# Patient Record
Sex: Male | Born: 1970 | Race: White | Hispanic: No | Marital: Married | Smoking: Never smoker
Health system: Southern US, Community
[De-identification: ages and names within clinical notes are randomized; demographics above are authoritative.]

## PROBLEM LIST (undated history)

## (undated) DIAGNOSIS — J45909 Unspecified asthma, uncomplicated: Secondary | ICD-10-CM

---

## 2012-02-21 ENCOUNTER — Encounter (HOSPITAL_BASED_OUTPATIENT_CLINIC_OR_DEPARTMENT_OTHER): Payer: Self-pay | Admitting: Emergency Medicine

## 2012-02-21 ENCOUNTER — Emergency Department (HOSPITAL_BASED_OUTPATIENT_CLINIC_OR_DEPARTMENT_OTHER)
Admission: EM | Admit: 2012-02-21 | Discharge: 2012-02-21 | Disposition: A | Discharge status: Home / Self Care | Payer: Self-pay | Attending: Emergency Medicine | Admitting: Emergency Medicine

## 2012-02-21 ENCOUNTER — Emergency Department (HOSPITAL_BASED_OUTPATIENT_CLINIC_OR_DEPARTMENT_OTHER): Payer: Self-pay

## 2012-02-21 DIAGNOSIS — J45909 Unspecified asthma, uncomplicated: Secondary | ICD-10-CM

## 2012-02-21 DIAGNOSIS — R509 Fever, unspecified: Secondary | ICD-10-CM | POA: Insufficient documentation

## 2012-02-21 DIAGNOSIS — J45901 Unspecified asthma with (acute) exacerbation: Secondary | ICD-10-CM | POA: Insufficient documentation

## 2012-02-21 DIAGNOSIS — R059 Cough, unspecified: Secondary | ICD-10-CM | POA: Insufficient documentation

## 2012-02-21 DIAGNOSIS — J40 Bronchitis, not specified as acute or chronic: Secondary | ICD-10-CM | POA: Insufficient documentation

## 2012-02-21 DIAGNOSIS — R05 Cough: Secondary | ICD-10-CM | POA: Insufficient documentation

## 2012-02-21 HISTORY — DX: Unspecified asthma, uncomplicated: J45.909

## 2012-02-21 MED ORDER — ALBUTEROL SULFATE (5 MG/ML) 0.5% IN NEBU
5.0000 mg | INHALATION_SOLUTION | Freq: Once | RESPIRATORY_TRACT | Status: AC
  Administered 2012-02-21: 5 mg via RESPIRATORY_TRACT
  Filled 2012-02-21: qty 1

## 2012-02-21 MED ORDER — METHYLPREDNISOLONE SODIUM SUCC 125 MG IJ SOLR
125.0000 mg | Freq: Every day | INTRAMUSCULAR | Status: DC
  Administered 2012-02-21: 125 mg via INTRAMUSCULAR

## 2012-02-21 MED ORDER — IPRATROPIUM BROMIDE 0.02 % IN SOLN
RESPIRATORY_TRACT | Status: AC
  Filled 2012-02-21: qty 2.5

## 2012-02-21 MED ORDER — ALBUTEROL SULFATE (5 MG/ML) 0.5% IN NEBU
INHALATION_SOLUTION | RESPIRATORY_TRACT | Status: AC
  Filled 2012-02-21: qty 1

## 2012-02-21 MED ORDER — AZITHROMYCIN 250 MG PO TABS: 250.0000 mg | ORAL_TABLET | Freq: Every day | ORAL | Status: AC

## 2012-02-21 MED ORDER — HYDROCODONE-HOMATROPINE 5-1.5 MG/5ML PO SYRP: 5.0000 mL | ORAL_SOLUTION | Freq: Four times a day (QID) | ORAL | Status: AC | PRN

## 2012-02-21 MED ORDER — METHYLPREDNISOLONE SODIUM SUCC 125 MG IJ SOLR
125.0000 mg | Freq: Every day | INTRAMUSCULAR | Status: DC
  Filled 2012-02-21: qty 2

## 2012-02-21 MED ORDER — IPRATROPIUM BROMIDE 0.02 % IN SOLN
0.5000 mg | Freq: Once | RESPIRATORY_TRACT | Status: AC
  Administered 2012-02-21: 0.5 mg via RESPIRATORY_TRACT

## 2012-02-21 MED ORDER — ALBUTEROL SULFATE (5 MG/ML) 0.5% IN NEBU
5.0000 mg | INHALATION_SOLUTION | Freq: Once | RESPIRATORY_TRACT | Status: AC
  Administered 2012-02-21: 5 mg via RESPIRATORY_TRACT

## 2012-02-21 MED ORDER — PREDNISONE 10 MG PO TABS: ORAL_TABLET | ORAL | Status: DC

## 2012-02-21 NOTE — ED Notes (Addendum)
Pt having URI symptoms since 02-05-12.   Pt having severe coughing spells and have caused a syncopal episode.  Pt having chest tightness when out in the cold.  No known fever.  Pt also has  Itchy rash to left lower arm.  Pt seen at [facility] for bronchitis earlier this month.

## 2012-02-21 NOTE — ED Provider Notes (Signed)
History     CSN: [id number]  Arrival date & time 02/21/12  1146   First MD Initiated Contact with Patient 02/21/12 1324      Chief Complaint  Patient presents with  . Nasal Congestion  . Cough  . Shortness of Breath    (Consider location/radiation/quality/duration/timing/severity/associated sxs/prior treatment) Patient is a 42 y.o. male presenting with cough. The history is provided by the patient. No language interpreter was used.  Cough Cough characteristics:  Productive Sputum characteristics:  White Severity:  Moderate Onset quality:  Gradual Timing:  Constant Progression:  Worsening Chronicity:  Recurrent Smoker: no   Relieved by:  Nothing Worsened by:  Nothing tried Associated symptoms: fever   Risk factors: recent infection   Risk factors: no chemical exposure   Pt reports he recently has been treat with kefles and prednisone.   Pt has continued wheezing.   Pt using nebulizer at home  Past Medical History  Diagnosis Date  . Asthma     No past surgical history on file.  No family history on file.  History  Substance Use Topics  . Smoking status: Not on file  . Smokeless tobacco: Not on file  . Alcohol Use: Not on file      Review of Systems  Constitutional: Positive for fever.  Respiratory: Positive for cough.   All other systems reviewed and are negative.    Allergies  Aspirin; Penicillins; and Tetanus toxoids  Home Medications   Current Outpatient Rx  Name  Route  Sig  Dispense  Refill  . guaiFENesin-dextromethorphan (ROBITUSSIN DM) 100-10 MG/5ML syrup   Oral   Take 5 mLs by mouth 3 (three) times daily as needed for cough.           BP 108/76  Pulse 92  Temp(Src) 98.9 F (37.2 C) (Oral)  Resp 18  Ht 5\' 4"  (1.626 m)  Wt 230 lb (104.327 kg)  BMI 39.46 kg/m2  SpO2 94%  Physical Exam  Nursing note and vitals reviewed. Constitutional: He is oriented to person, place, and time. He appears well-developed and well-nourished.  HENT:   Left Ear: External ear normal.  Eyes: Pupils are equal, round, and reactive to light.  Neck: Normal range of motion. Neck supple.  Cardiovascular: Normal rate and normal heart sounds.   Pulmonary/Chest: Effort normal and breath sounds normal.  Abdominal: Soft. Bowel sounds are normal.  Musculoskeletal: Normal range of motion.  Neurological: He is alert and oriented to person, place, and time.  Skin: Skin is warm.  Psychiatric: He has a normal mood and affect.    ED Course  Procedures (including critical care time)  Labs Reviewed - No data to display Dg Chest 2 View  02/21/2012  *RADIOLOGY REPORT*  Clinical Data: Nasal congestion, cough, shortness of breath  CHEST - 2 VIEW  Comparison: None.  Findings: No active infiltrate or effusion is seen. There is some prominence of peribronchial thickening and bronchitis is a consideration.  Mediastinal contours appear normal.  The heart is within normal limits in size in view of the poor inspiration taken. No acute skeletal abnormality is seen.  IMPRESSION: No pneumonia.  Cannot exclude bronchitis.   Original Report Authenticated By: Dwyane Dee, M.D.      1. Bronchitis   2. Asthma       MDM      Pt given solumedrol and albuterol here.   I will put pt on prednisone taper, zithromax and tussionex.    I advised pt to see  his MD for recheck on Monday.       Lonia Skinner [place], [place] 02/21/12 [phone]

## 2012-02-22 NOTE — ED Provider Notes (Signed)
History/physical exam/procedure(s) were performed by non-physician practitioner and as supervising physician I was immediately available for consultation/collaboration. I have reviewed all notes and am in agreement with care and plan.   Hilario Quarry, MD 02/22/12 [phone]

## 2013-12-26 ENCOUNTER — Emergency Department (HOSPITAL_BASED_OUTPATIENT_CLINIC_OR_DEPARTMENT_OTHER)
Admission: EM | Admit: 2013-12-26 | Discharge: 2013-12-26 | Disposition: A | Discharge status: Home / Self Care | Payer: Self-pay | Attending: Emergency Medicine | Admitting: Emergency Medicine

## 2013-12-26 ENCOUNTER — Emergency Department (HOSPITAL_BASED_OUTPATIENT_CLINIC_OR_DEPARTMENT_OTHER): Payer: Self-pay

## 2013-12-26 ENCOUNTER — Encounter (HOSPITAL_BASED_OUTPATIENT_CLINIC_OR_DEPARTMENT_OTHER): Payer: Self-pay

## 2013-12-26 DIAGNOSIS — R05 Cough: Secondary | ICD-10-CM

## 2013-12-26 DIAGNOSIS — J45901 Unspecified asthma with (acute) exacerbation: Secondary | ICD-10-CM | POA: Insufficient documentation

## 2013-12-26 DIAGNOSIS — R059 Cough, unspecified: Secondary | ICD-10-CM

## 2013-12-26 DIAGNOSIS — Z79899 Other long term (current) drug therapy: Secondary | ICD-10-CM | POA: Insufficient documentation

## 2013-12-26 DIAGNOSIS — R0789 Other chest pain: Secondary | ICD-10-CM | POA: Insufficient documentation

## 2013-12-26 DIAGNOSIS — Z880 Allergy status to penicillin: Secondary | ICD-10-CM | POA: Insufficient documentation

## 2013-12-26 DIAGNOSIS — Z7952 Long term (current) use of systemic steroids: Secondary | ICD-10-CM | POA: Insufficient documentation

## 2013-12-26 MED ORDER — HYDROCODONE-HOMATROPINE 5-1.5 MG/5ML PO SYRP
5.0000 mL | ORAL_SOLUTION | Freq: Once | ORAL | Status: DC
  Filled 2013-12-26: qty 5

## 2013-12-26 MED ORDER — PREDNISONE 20 MG PO TABS: 40.0000 mg | ORAL_TABLET | Freq: Every day | ORAL | Status: DC

## 2013-12-26 MED ORDER — ALBUTEROL (5 MG/ML) CONTINUOUS INHALATION SOLN
15.0000 mg/h | INHALATION_SOLUTION | RESPIRATORY_TRACT | Status: AC
  Filled 2013-12-26: qty 20

## 2013-12-26 MED ORDER — IPRATROPIUM BROMIDE 0.02 % IN SOLN
0.5000 mg | Freq: Once | RESPIRATORY_TRACT | Status: AC
  Administered 2013-12-26: 0.5 mg via RESPIRATORY_TRACT
  Filled 2013-12-26: qty 2.5

## 2013-12-26 MED ORDER — ALBUTEROL SULFATE (2.5 MG/3ML) 0.083% IN NEBU: 2.5000 mg | INHALATION_SOLUTION | Freq: Four times a day (QID) | RESPIRATORY_TRACT | Status: DC | PRN

## 2013-12-26 MED ORDER — GUAIFENESIN-CODEINE 100-10 MG/5ML PO SOLN: 10.0000 mL | Freq: Three times a day (TID) | ORAL | Status: AC | PRN

## 2013-12-26 MED ORDER — ALBUTEROL (5 MG/ML) CONTINUOUS INHALATION SOLN
15.0000 mg/h | INHALATION_SOLUTION | RESPIRATORY_TRACT | Status: AC
  Administered 2013-12-26: 15 mg/h via RESPIRATORY_TRACT

## 2013-12-26 MED ORDER — ALBUTEROL SULFATE HFA 108 (90 BASE) MCG/ACT IN AERS
2.0000 | INHALATION_SPRAY | Freq: Once | RESPIRATORY_TRACT | Status: AC
  Administered 2013-12-26: 2 via RESPIRATORY_TRACT
  Filled 2013-12-26: qty 6.7

## 2013-12-26 MED ORDER — METHYLPREDNISOLONE SODIUM SUCC 125 MG IJ SOLR
125.0000 mg | Freq: Once | INTRAMUSCULAR | Status: AC
  Administered 2013-12-26: 125 mg via INTRAMUSCULAR
  Filled 2013-12-26: qty 2

## 2013-12-26 MED ORDER — GUAIFENESIN-CODEINE 100-10 MG/5ML PO SOLN
10.0000 mL | Freq: Once | ORAL | Status: AC
  Administered 2013-12-26: 10 mL via ORAL
  Filled 2013-12-26: qty 10

## 2013-12-26 NOTE — ED Notes (Signed)
pa at bedside. 

## 2013-12-26 NOTE — ED Notes (Signed)
Pt given ginger ale.

## 2013-12-26 NOTE — ED Provider Notes (Signed)
CSN: [id number]     Arrival date & time 12/26/13  1017 History   First MD Initiated Contact with Patient 12/26/13 1029     Chief Complaint  Patient presents with  . Shortness of Breath     (Consider location/radiation/quality/duration/timing/severity/associated sxs/prior Treatment) HPI Comments: Patient is a 43 year old male past medical history significant for asthma presenting to the emergency department with 2 weeks of gradually worsening cough with associated wheezing post tussive chest tightness. States he has been using his at-home albuterol inhaler and nebulizer no improvement. He states he has been taking 1-2 puffs of his inhaler every hour for the last few days. States he has tried his nebulizer 3 times a day with no improvement. Denies any fevers, chills, nausea, vomiting, abdominal pain, diarrhea. He was admitted last year for asthma exacerbation.  Patient is a 43 y.o. male presenting with shortness of breath.  Shortness of Breath Associated symptoms: cough and wheezing     Past Medical History  Diagnosis Date  . Asthma    History reviewed. No pertinent past surgical history. No family history on file. History  Substance Use Topics  . Smoking status: Never Smoker   . Smokeless tobacco: Not on file  . Alcohol Use: No    Review of Systems  Respiratory: Positive for cough, chest tightness, shortness of breath and wheezing.   All other systems reviewed and are negative.     Allergies  Aspirin; Penicillins; and Tetanus toxoids  Home Medications   Prior to Admission medications   Medication Sig Start Date End Date Taking? Authorizing Provider  albuterol (PROVENTIL) (2.5 MG/3ML) 0.083% nebulizer solution Take 3 mLs (2.5 mg total) by nebulization every 6 (six) hours as needed for wheezing or shortness of breath. 12/26/13   Branston Halsted L Faydra Korman, PA-C  azithromycin (ZITHROMAX) 250 MG tablet Take 1 tablet (250 mg total) by mouth daily. Take first 2 tablets together,  then 1 every day until finished. 02/21/12   Elson AreasLeslie K Sofia, PA-C  guaiFENesin-codeine 100-10 MG/5ML syrup Take 10 mLs by mouth 3 (three) times daily as needed for cough. 12/26/13   Sonni Barse L Ceili Boshers, PA-C  guaiFENesin-dextromethorphan (ROBITUSSIN DM) 100-10 MG/5ML syrup Take 5 mLs by mouth 3 (three) times daily as needed for cough.    Historical Provider, MD  HYDROcodone-homatropine (HYCODAN) 5-1.5 MG/5ML syrup Take 5 mLs by mouth every 6 (six) hours as needed for cough. 02/21/12   Elson AreasLeslie K Sofia, PA-C  predniSONE (DELTASONE) 10 MG tablet 6,5,4,3,2,1 taper 02/21/12   Elson AreasLeslie K Sofia, PA-C  predniSONE (DELTASONE) 20 MG tablet Take 2 tablets (40 mg total) by mouth daily. 12/26/13   Jessamine Barcia L Allard Lightsey, PA-C   BP 116/70 mmHg  Pulse 108  Temp(Src) 98.2 F (36.8 C) (Oral)  Resp 20  Ht 5\' 5"  (1.651 m)  Wt 240 lb (108.863 kg)  BMI 39.94 kg/m2  SpO2 97% Physical Exam  Constitutional: He is oriented to person, place, and time. He appears well-developed and well-nourished. No distress.  HENT:  Head: Normocephalic and atraumatic.  Right Ear: Hearing, tympanic membrane, external ear and ear canal normal.  Left Ear: Hearing, tympanic membrane, external ear and ear canal normal.  Nose: Nose normal.  Mouth/Throat: Oropharynx is clear and moist.  Eyes: Conjunctivae are normal.  Neck: Normal range of motion. Neck supple.  Cardiovascular: Normal rate, regular rhythm and normal heart sounds.   Pulmonary/Chest: Accessory muscle usage present. He has wheezes (expiratory). He has no rales. He exhibits no tenderness.  Coughing  Abdominal: Soft. There  is no tenderness.  Musculoskeletal: Normal range of motion.  Lymphadenopathy:    He has no cervical adenopathy.  Neurological: He is alert and oriented to person, place, and time.  Skin: Skin is warm and dry. He is not diaphoretic.  Psychiatric: He has a normal mood and affect.  Nursing note and vitals reviewed.   ED Course  Procedures (including  critical care time) Medications  albuterol (PROVENTIL,VENTOLIN) solution continuous neb (15 mg/hr Nebulization Not Given 12/26/13 1130)  albuterol (PROVENTIL,VENTOLIN) solution continuous neb (0 mg/hr Nebulization Stopped 12/26/13 1154)  methylPREDNISolone sodium succinate (SOLU-MEDROL) 125 mg/2 mL injection 125 mg (125 mg Intramuscular Given 12/26/13 1054)  ipratropium (ATROVENT) nebulizer solution 0.5 mg (0.5 mg Nebulization Given 12/26/13 1046)  guaiFENesin-codeine 100-10 MG/5ML solution 10 mL (10 mLs Oral Given 12/26/13 1058)  albuterol (PROVENTIL HFA;VENTOLIN HFA) 108 (90 BASE) MCG/ACT inhaler 2 puff (2 puffs Inhalation Given 12/26/13 1439)    Labs Review Labs Reviewed - No data to display  Imaging Review Dg Chest 2 View  12/26/2013   CLINICAL DATA:  Cough and short of breath  EXAM: CHEST  2 VIEW  COMPARISON:  02/21/2012  FINDINGS: Hypoventilation with mild bibasilar atelectasis similar to the prior study. Negative for heart failure or pneumonia. No pleural effusion.  IMPRESSION: Mild bibasilar atelectasis similar to the prior study. No superimposed acute abnormality.   Electronically Signed   By: Marlan Palauharles  Clark M.D.   On: 12/26/2013 13:56     EKG Interpretation None      MDM   Final diagnoses:  Cough  Asthma exacerbation    Filed Vitals:   12/26/13 1450  BP: 116/70  Pulse: 108  Temp: 98.2 F (36.8 C)  Resp: 20   Afebrile, NAD, non-toxic appearing, AAOx4.   Patient in ED with O2 saturations maintained >90, no current signs of respiratory distress. Lung exam improved after nebulizer treatment. Solumedrol given in the ED and pt will bd dc with 5 day burst of prednisone. Pt states they are breathing at baseline. Pt has been instructed to continue using prescribed medications and to speak with PCP about today's exacerbation. Return precautions discussed. Patient is agreeable to plan and wants to be discharged home. Patient is stable at time of discharge       Jeannetta EllisJennifer  L Shyne Resch, PA-C 12/26/13 1554  Vanetta MuldersScott Zackowski, MD 12/27/13 [phone]

## 2013-12-26 NOTE — Discharge Instructions (Signed)
Please follow up with your primary care physician in 1-2 days. If you do not have one please call the [facility] number listed above. Please take medications as prescribed. Please use your inhaler or nebulizer every two to three hours as needed for cough, shortness of breath, wheezing. Please read all discharge instructions and return precautions.    Asthma Asthma is a recurring condition in which the airways tighten and narrow. Asthma can make it difficult to breathe. It can cause coughing, wheezing, and shortness of breath. Asthma episodes, also called asthma attacks, range from minor to life-threatening. Asthma cannot be cured, but medicines and lifestyle changes can help control it. CAUSES Asthma is believed to be caused by inherited (genetic) and environmental factors, but its exact cause is unknown. Asthma may be triggered by allergens, lung infections, or irritants in the air. Asthma triggers are different for each person. Common triggers include:   Animal dander.  Dust mites.  Cockroaches.  Pollen from trees or grass.  Mold.  Smoke.  Air pollutants such as dust, household cleaners, hair sprays, aerosol sprays, paint fumes, strong chemicals, or strong odors.  Cold air, weather changes, and winds (which increase molds and pollens in the air).  Strong emotional expressions such as crying or laughing hard.  Stress.  Certain medicines (such as aspirin) or types of drugs (such as beta-blockers).  Sulfites in foods and drinks. Foods and drinks that may contain sulfites include dried fruit, potato chips, and sparkling grape juice.  Infections or inflammatory conditions such as the flu, a cold, or an inflammation of the nasal membranes (rhinitis).  Gastroesophageal reflux disease (GERD).  Exercise or strenuous activity. SYMPTOMS Symptoms may occur immediately after asthma is triggered or many hours later. Symptoms include:  Wheezing.  Excessive nighttime  or early morning coughing.  Frequent or severe coughing with a common cold.  Chest tightness.  Shortness of breath. DIAGNOSIS  The diagnosis of asthma is made by a review of your medical history and a physical exam. Tests may also be performed. These may include:  Lung function studies. These tests show how much air you breathe in and out.  Allergy tests.  Imaging tests such as X-rays. TREATMENT  Asthma cannot be cured, but it can usually be controlled. Treatment involves identifying and avoiding your asthma triggers. It also involves medicines. There are 2 classes of medicine used for asthma treatment:   Controller medicines. These prevent asthma symptoms from occurring. They are usually taken every day.  Reliever or rescue medicines. These quickly relieve asthma symptoms. They are used as needed and provide short-term relief. Your health care provider will help you create an asthma action plan. An asthma action plan is a written plan for managing and treating your asthma attacks. It includes a list of your asthma triggers and how they may be avoided. It also includes information on when medicines should be taken and when their dosage should be changed. An action plan may also involve the use of a device called a peak flow meter. A peak flow meter measures how well the lungs are working. It helps you monitor your condition. HOME CARE INSTRUCTIONS   Take medicines only as directed by your health care provider. Speak with your health care provider if you have questions about how or when to take the medicines.  Use a peak flow meter as directed by your health care provider. Record and keep track of readings.  Understand and use the action plan to help  minimize or stop an asthma attack without needing to seek medical care.  Control your home environment in the following ways to help prevent asthma attacks:  Do not smoke. Avoid being exposed to secondhand smoke.  Change your heating and  air conditioning filter regularly.  Limit your use of fireplaces and wood stoves.  Get rid of pests (such as roaches and mice) and their droppings.  Throw away plants if you see mold on them.  Clean your floors and dust regularly. Use unscented cleaning products.  Try to have someone else vacuum for you regularly. Stay out of rooms while they are being vacuumed and for a short while afterward. If you vacuum, use a dust mask from a hardware store, a double-layered or microfilter vacuum cleaner bag, or a vacuum cleaner with a HEPA filter.  Replace carpet with wood, tile, or vinyl flooring. Carpet can trap dander and dust.  Use allergy-proof pillows, mattress covers, and box spring covers.  Wash bed sheets and blankets every week in hot water and dry them in a dryer.  Use blankets that are made of polyester or cotton.  Clean bathrooms and kitchens with bleach. If possible, have someone repaint the walls in these rooms with mold-resistant paint. Keep out of the rooms that are being cleaned and painted.  Wash hands frequently. SEEK MEDICAL CARE IF:   You have wheezing, shortness of breath, or a cough even if taking medicine to prevent attacks.  The colored mucus you cough up (sputum) is thicker than usual.  Your sputum changes from clear or white to yellow, green, gray, or bloody.  You have any problems that may be related to the medicines you are taking (such as a rash, itching, swelling, or trouble breathing).  You are using a reliever medicine more than 2-3 times per week.  Your peak flow is still at 50-79% of your personal best after following your action plan for 1 hour.  You have a fever. SEEK IMMEDIATE MEDICAL CARE IF:   You seem to be getting worse and are unresponsive to treatment during an asthma attack.  You are short of breath even at rest.  You get short of breath when doing very little physical activity.  You have difficulty eating, drinking, or talking due to  asthma symptoms.  You develop chest pain.  You develop a fast heartbeat.  You have a bluish color to your lips or fingernails.  You are light-headed, dizzy, or faint.  Your peak flow is less than 50% of your personal best. MAKE SURE YOU:   Understand these instructions.  Will watch your condition.  Will get help right away if you are not doing well or get worse. Document Released: 12/30/2004 Document Revised: 05/16/2013 Document Reviewed: 07/29/2012 [facility] Patient Information 2015 [place] [place]. This information is not intended to replace advice given to you by your health care provider. Make sure you discuss any questions you have with your health care provider.

## 2013-12-26 NOTE — ED Notes (Signed)
C/o cough x 2 week. Reports wheezing. No relief with treatments at home

## 2014-01-10 ENCOUNTER — Emergency Department (HOSPITAL_BASED_OUTPATIENT_CLINIC_OR_DEPARTMENT_OTHER): Payer: Self-pay

## 2014-01-10 ENCOUNTER — Encounter (HOSPITAL_BASED_OUTPATIENT_CLINIC_OR_DEPARTMENT_OTHER): Payer: Self-pay | Admitting: Emergency Medicine

## 2014-01-10 ENCOUNTER — Emergency Department (HOSPITAL_BASED_OUTPATIENT_CLINIC_OR_DEPARTMENT_OTHER)
Admission: EM | Admit: 2014-01-10 | Discharge: 2014-01-10 | Disposition: A | Discharge status: Home / Self Care | Payer: Self-pay | Attending: Emergency Medicine | Admitting: Emergency Medicine

## 2014-01-10 DIAGNOSIS — R0600 Dyspnea, unspecified: Secondary | ICD-10-CM

## 2014-01-10 DIAGNOSIS — Z880 Allergy status to penicillin: Secondary | ICD-10-CM | POA: Insufficient documentation

## 2014-01-10 DIAGNOSIS — J40 Bronchitis, not specified as acute or chronic: Secondary | ICD-10-CM

## 2014-01-10 DIAGNOSIS — J45901 Unspecified asthma with (acute) exacerbation: Secondary | ICD-10-CM | POA: Insufficient documentation

## 2014-01-10 DIAGNOSIS — Z79899 Other long term (current) drug therapy: Secondary | ICD-10-CM | POA: Insufficient documentation

## 2014-01-10 MED ORDER — ALBUTEROL SULFATE HFA 108 (90 BASE) MCG/ACT IN AERS
1.0000 | INHALATION_SPRAY | RESPIRATORY_TRACT | Status: AC | PRN
Start: 2014-01-10 — End: ?

## 2014-01-10 MED ORDER — ALBUTEROL SULFATE HFA 108 (90 BASE) MCG/ACT IN AERS
2.0000 | INHALATION_SPRAY | Freq: Once | RESPIRATORY_TRACT | Status: AC
  Administered 2014-01-10: 2 via RESPIRATORY_TRACT
  Filled 2014-01-10: qty 6.7

## 2014-01-10 MED ORDER — ALBUTEROL SULFATE (2.5 MG/3ML) 0.083% IN NEBU
2.5000 mg | INHALATION_SOLUTION | Freq: Once | RESPIRATORY_TRACT | Status: AC
  Administered 2014-01-10: 2.5 mg via RESPIRATORY_TRACT

## 2014-01-10 MED ORDER — DOXYCYCLINE HYCLATE 100 MG PO CAPS: 100.0000 mg | ORAL_CAPSULE | Freq: Two times a day (BID) | ORAL | Status: AC

## 2014-01-10 MED ORDER — ALBUTEROL SULFATE (2.5 MG/3ML) 0.083% IN NEBU
INHALATION_SOLUTION | RESPIRATORY_TRACT | Status: AC
  Filled 2014-01-10: qty 3

## 2014-01-10 MED ORDER — IPRATROPIUM-ALBUTEROL 0.5-2.5 (3) MG/3ML IN SOLN
3.0000 mL | Freq: Once | RESPIRATORY_TRACT | Status: AC
  Administered 2014-01-10: 3 mL via RESPIRATORY_TRACT

## 2014-01-10 MED ORDER — PREDNISONE 20 MG PO TABS: ORAL_TABLET | ORAL | Status: AC

## 2014-01-10 MED ORDER — IPRATROPIUM-ALBUTEROL 0.5-2.5 (3) MG/3ML IN SOLN
3.0000 mL | RESPIRATORY_TRACT | Status: DC
  Administered 2014-01-10: 3 mL via RESPIRATORY_TRACT
  Filled 2014-01-10: qty 3

## 2014-01-10 MED ORDER — IPRATROPIUM-ALBUTEROL 0.5-2.5 (3) MG/3ML IN SOLN
RESPIRATORY_TRACT | Status: AC
  Filled 2014-01-10: qty 3

## 2014-01-10 NOTE — ED Notes (Signed)
Pt states he has asthma, wife lit some incense last night and started to cause some sob and coughing.  Pt treated for URI approx 2 weeks ago.

## 2014-01-10 NOTE — ED Notes (Signed)
Resting HR 109 and SpO2 96% on RA after ambulating in dept HR 124, SpO2 96%.  Pt states "I feel a little better and i feel like i can take a deeper breath."  No increased WOB.

## 2014-01-10 NOTE — ED Provider Notes (Signed)
CSN: [id number]     Arrival date & time 01/10/14  1029 History   First MD Initiated Contact with Patient 01/10/14 1108     Chief Complaint  Patient presents with  . Cough  . Shortness of Breath     (Consider location/radiation/quality/duration/timing/severity/associated sxs/prior Treatment) Patient is a 43 y.o. male presenting with cough and shortness of breath. The history is provided by the patient and a friend.  Cough Cough characteristics:  Productive Sputum characteristics:  Nondescript and clear Severity:  Moderate Duration:  3 weeks Timing: had been improved until last night when his wife as burning incense in the house. Chronicity:  Recurrent Smoker: no   Context: smoke exposure   Context comment:  Exposure to incense Relieved by:  Home nebulizer Exacerbated by: exposure to smoke. Associated symptoms: shortness of breath and wheezing   Associated symptoms: no chest pain, no fever, no headaches, no rash and no rhinorrhea   Associated symptoms comment:  Chest tightness, cough, wheezing Shortness of breath:    Severity:  Moderate   Duration:  3 weeks   Progression:  Waxing and waning Risk factors: chemical exposure   Shortness of Breath Associated symptoms: cough and wheezing   Associated symptoms: no abdominal pain, no chest pain, no fever, no headaches, no rash and no vomiting     Past Medical History  Diagnosis Date  . Asthma    No past surgical history on file. No family history on file. History  Substance Use Topics  . Smoking status: Never Smoker   . Smokeless tobacco: Not on file  . Alcohol Use: No    Review of Systems  Constitutional: Negative for fever, activity change, appetite change and fatigue.  HENT: Negative for congestion, facial swelling, rhinorrhea and trouble swallowing.   Eyes: Negative for photophobia and pain.  Respiratory: Positive for cough, shortness of breath and wheezing. Negative for chest tightness.   Cardiovascular: Negative  for chest pain and leg swelling.  Gastrointestinal: Negative for nausea, vomiting, abdominal pain, diarrhea and constipation.  Endocrine: Negative for polydipsia and polyuria.  Genitourinary: Negative for dysuria, urgency, decreased urine volume and difficulty urinating.  Musculoskeletal: Negative for back pain and gait problem.  Skin: Negative for color change, rash and wound.  Allergic/Immunologic: Negative for immunocompromised state.  Neurological: Negative for dizziness, facial asymmetry, speech difficulty, weakness, numbness and headaches.  Psychiatric/Behavioral: Negative for confusion, decreased concentration and agitation.      Allergies  Aspirin; Penicillins; and Tetanus toxoids  Home Medications   Prior to Admission medications   Medication Sig Start Date End Date Taking? Authorizing Provider  albuterol (PROVENTIL HFA;VENTOLIN HFA) 108 (90 BASE) MCG/ACT inhaler Inhale 1-2 puffs into the lungs every 4 (four) hours as needed for wheezing or shortness of breath. 01/10/14   Toy CookeyMegan Romilda Proby, MD  azithromycin (ZITHROMAX) 250 MG tablet Take 1 tablet (250 mg total) by mouth daily. Take first 2 tablets together, then 1 every day until finished. 02/21/12   Elson AreasLeslie K Sofia, PA-C  doxycycline (VIBRAMYCIN) 100 MG capsule Take 1 capsule (100 mg total) by mouth 2 (two) times daily. One po bid x 7 days 01/10/14   Toy CookeyMegan Vlada Uriostegui, MD  guaiFENesin-codeine 100-10 MG/5ML syrup Take 10 mLs by mouth 3 (three) times daily as needed for cough. 12/26/13   Jennifer L Piepenbrink, PA-C  guaiFENesin-dextromethorphan (ROBITUSSIN DM) 100-10 MG/5ML syrup Take 5 mLs by mouth 3 (three) times daily as needed for cough.    Historical Provider, MD  HYDROcodone-homatropine [facility](HYCODAN) 5-1.5 MG/5ML syrup Take 5  mLs by mouth every 6 (six) hours as needed for cough. 02/21/12   Elson AreasLeslie K Sofia, PA-C  predniSONE (DELTASONE) 20 MG tablet 3 tabs po day one, then 2 po daily x 4 days 01/10/14   Toy CookeyMegan Ester Hilley, MD   BP 140/82 mmHg   Pulse 117  Temp(Src) 98.1 F (36.7 C) (Oral)  Resp 16  Ht 5\' 6"  (1.676 m)  Wt 240 lb (108.863 kg)  BMI 38.76 kg/m2  SpO2 96% Physical Exam  Constitutional: He is oriented to person, place, and time. He appears well-developed and well-nourished. No distress.  HENT:  Head: Normocephalic and atraumatic.  Mouth/Throat: No oropharyngeal exudate.  Eyes: Pupils are equal, round, and reactive to light.  Neck: Normal range of motion. Neck supple.  Cardiovascular: Normal rate, regular rhythm and normal heart sounds.  Exam reveals no gallop and no friction rub.   No murmur heard. Pulmonary/Chest: Effort normal. No respiratory distress. He has wheezes in the right upper field and the right middle field. He has no rales.  Abdominal: Soft. Bowel sounds are normal. He exhibits no distension and no mass. There is no tenderness. There is no rebound and no guarding.  Musculoskeletal: Normal range of motion. He exhibits no edema or tenderness.  Neurological: He is alert and oriented to person, place, and time.  Skin: Skin is warm and dry.  Psychiatric: He has a normal mood and affect.    ED Course  Procedures (including critical care time) Labs Review Labs Reviewed - No data to display  Imaging Review Dg Chest 2 View  01/10/2014   CLINICAL DATA:  Cough and wheezing for the last 2 weeks. Chest tightness.  EXAM: CHEST  2 VIEW  COMPARISON:  12/26/2013.  02/21/2012.  FINDINGS: Heart size is normal. Mediastinal shadows are normal. There is central bronchial thickening but there is no infiltrate, collapse or effusion. Ordinary degenerative changes affect the spine.  IMPRESSION: Bronchitis.  No consolidation or collapse.   Electronically Signed   By: Paulina FusiMark  Shogry M.D.   On: 01/10/2014 12:05     EKG Interpretation None      MDM   Final diagnoses:  Dyspnea  Wheezy bronchitis    Pt is a 43 y.o. male with Pmhx as above who presents with about 3 weeks of coughing and wheezing, chest tightness, which  had improved after course of steroids, before worsening last night when he was exposed to smoke from burning incense.  On physical exam he is mildly tachycardic, has wheezing in the right mid and right upper lung fields, but has no evidence of respiratory distress.  He denies fevers, chills, body aches.  He has had a cough that is been productive of clear sputum.  He denies lower extremity pain or edema.   12:52 PM Pt feelimg somehwat improved after 2nd duoneb, wheezing resolved. HR 110's with coughing, but improved to upper 90's at rest. CXR w/ bronchitis, no pna. Pt ambulated w/ pulse ox without drop. Will d/c hoem w/ 5 days steroids and Rx for doxy given symptoms present for 3 weeks.     Jamse ArnEdward W Heppler Jr. evaluation in the Emergency Department is complete. It has been determined that no acute conditions requiring further emergency intervention are present at this time. The patient/guardian have been advised of the diagnosis and plan. We have discussed signs and symptoms that warrant return to the ED, such as changes or worsening in symptoms, worsening pain, worsening SOB.       Toy CookeyMegan Asmi Fugere, MD 01/10/14  1252 

## 2014-01-10 NOTE — Discharge Instructions (Signed)
Chronic Asthmatic Bronchitis Chronic asthmatic bronchitis is a complication of persistent asthma. After a period of time with asthma, some people develop airflow obstruction that is present all the time, even when not having an asthma attack.There is also persistent inflammation of the airways, and the bronchial tubes produce more mucus. Chronic asthmatic bronchitis usually is a permanent problem with the lungs. CAUSES  Chronic asthmatic bronchitis happens most often in people who have asthma and also smoke cigarettes. Occasionally, it can happen to a person with long-standing or severe asthma even if the person is not a smoker. SIGNS AND SYMPTOMS  Chronic asthmatic bronchitis usually causes symptoms of both asthma and chronic bronchitis, including:   Coughing.  Increased sputum production.  Wheezing and shortness of breath.  Chest discomfort.  Recurring infections. DIAGNOSIS  Your health care provider will take a medical history and perform a physical exam. Chronic asthmatic bronchitis is suspected when a person with asthma has abnormal results on breathing tests (pulmonary function tests) even when breathing symptoms are at their best. Other tests, such as a chest X-ray, may be performed to rule out other conditions.  TREATMENT  Treatment involves controlling symptoms with medicine and lifestyle changes.  Your health care provider may prescribe asthma medicines, including inhaler and nebulizer medicines.  Infection can be treated with medicine to kill germs (antibiotics). Serious infections may require hospitalization. These can include:  Pneumonia.  Sinus infections.  Acute bronchitis.   Preventing infection and hospitalization is very important. Get an influenza vaccination every year as directed by your health care provider. Ask your health care provider whether you need a pneumonia vaccine.  Ask your health care provider whether you would benefit from a pulmonary  rehabilitation program. HOME CARE INSTRUCTIONS  Take medicines only as directed by your health care provider.  If you are a cigarette smoker, the most important thing that you can do is quit. Talk to your health care provider for help with quitting smoking.  Avoid pollen, dust, animal dander, molds, smoke, and other things that cause attacks.  Regular exercise is very important to help you feel better. Discuss possible exercise routines with your health care provider.  If animal dander is the cause of asthma, you may not be able to keep pets.  It is important that you:  Become educated about your medical condition.  Participate in maintaining wellness.  Seek medical care as directed. Delay in seeking medical care could cause permanent injury and may be a risk to your life. SEEK MEDICAL CARE IF:  You have wheezing and shortness of breath even if taking medicine to prevent attacks.  You have muscle aches, chest pain, or thickening of sputum.  Your sputum changes from clear or white to yellow, green, gray, or bloody. SEEK IMMEDIATE MEDICAL CARE IF:  Your usual medicines do not stop your wheezing.  You have increased coughing or shortness of breath or both.  You have increased difficulty breathing.  You have any problems from the medicine you are taking, such as a rash, itching, swelling, or trouble breathing. MAKE SURE YOU:   Understand these instructions.  Will watch your condition.  Will get help right away if you are not doing well or get worse. Document Released: 10/17/2005 Document Revised: 05/16/2013 Document Reviewed: 02/07/2013 [employer] Patient Information 2015 [employer]. This information is not intended to replace advice given to you by your health care provider. Make sure you discuss any questions you have with your health care provider.  

## 2014-07-03 ENCOUNTER — Encounter (HOSPITAL_BASED_OUTPATIENT_CLINIC_OR_DEPARTMENT_OTHER): Payer: Self-pay | Admitting: *Deleted

## 2014-07-03 ENCOUNTER — Emergency Department (HOSPITAL_BASED_OUTPATIENT_CLINIC_OR_DEPARTMENT_OTHER)
Admission: EM | Admit: 2014-07-03 | Discharge: 2014-07-03 | Disposition: A | Discharge status: Home / Self Care | Payer: Self-pay | Attending: Emergency Medicine | Admitting: Emergency Medicine

## 2014-07-03 DIAGNOSIS — Z880 Allergy status to penicillin: Secondary | ICD-10-CM | POA: Insufficient documentation

## 2014-07-03 DIAGNOSIS — J45909 Unspecified asthma, uncomplicated: Secondary | ICD-10-CM | POA: Insufficient documentation

## 2014-07-03 DIAGNOSIS — Z9889 Other specified postprocedural states: Secondary | ICD-10-CM | POA: Insufficient documentation

## 2014-07-03 DIAGNOSIS — M25562 Pain in left knee: Secondary | ICD-10-CM | POA: Insufficient documentation

## 2014-07-03 DIAGNOSIS — Z79899 Other long term (current) drug therapy: Secondary | ICD-10-CM | POA: Insufficient documentation

## 2014-07-03 DIAGNOSIS — M25561 Pain in right knee: Secondary | ICD-10-CM | POA: Insufficient documentation

## 2014-07-03 MED ORDER — TRAMADOL HCL 50 MG PO TABS: 50.0000 mg | ORAL_TABLET | Freq: Four times a day (QID) | ORAL | Status: AC | PRN

## 2014-07-03 MED ORDER — NAPROXEN 500 MG PO TABS: 500.0000 mg | ORAL_TABLET | Freq: Two times a day (BID) | ORAL | Status: AC

## 2014-07-03 NOTE — ED Notes (Signed)
Pain in his knees and lower legs for the past year. Swelling in his knees. Hard for him to walk up steps this am.

## 2014-07-03 NOTE — ED Provider Notes (Signed)
CSN: [id number]     Arrival date & time 07/03/14  1549 History  This chart was scribed for Joycie Peek, PA-C, working with Gwyneth Sprout, MD by Octavia Heir, ED Scribe. This patient was seen in room MH04/MH04 and the patient's care was started at 4:07 PM.    Chief Complaint  Patient presents with  . Knee Pain     The history is provided by the patient. No language interpreter was used.    HPI Comments: Antonio David is a 44 y.o. male who has hx of asthma presents to the Emergency Department complaining of intermittent, gradual worsening bilateral lower extremity pain onset a few months ago. Pt states a sharp pain in his knee joint that is also a burning sensation. Pt notes it has been getting worse the past few weeks. Pt notes this morning it was hard for him to ambulate this morning. Pt has been taking OTC tylenol and ibuprofen to alleviate the pain with minimal relief. He notes elevating his legs which is a modifying factor with his pain. He notes having a prior right knee surgery.  He denies sob, chest pain. Pt is allergic to aspirin and penicillin.no hemoptysis or unilateral leg swelling.  Past Medical History  Diagnosis Date  . Asthma    No past surgical history on file. No family history on file. History  Substance Use Topics  . Smoking status: Never Smoker   . Smokeless tobacco: Not on file  . Alcohol Use: No    Review of Systems  A complete 10 system review of systems was obtained and all systems are negative except as noted in the HPI and PMH.    Allergies  Aspirin; Penicillins; and Tetanus toxoids  Home Medications   Prior to Admission medications   Medication Sig Start Date End Date Taking? Authorizing Provider  albuterol (PROVENTIL HFA;VENTOLIN HFA) 108 (90 BASE) MCG/ACT inhaler Inhale 1-2 puffs into the lungs every 4 (four) hours as needed for wheezing or shortness of breath. 01/10/14   Toy Cookey, MD  azithromycin (ZITHROMAX) 250 MG tablet Take 1  tablet (250 mg total) by mouth daily. Take first 2 tablets together, then 1 every day until finished. 02/21/12   Elson Areas, PA-C  doxycycline (VIBRAMYCIN) 100 MG capsule Take 1 capsule (100 mg total) by mouth 2 (two) times daily. One po bid x 7 days 01/10/14   Toy Cookey, MD  guaiFENesin-codeine 100-10 MG/5ML syrup Take 10 mLs by mouth 3 (three) times daily as needed for cough. 12/26/13   Jennifer Piepenbrink, PA-C  guaiFENesin-dextromethorphan (ROBITUSSIN DM) 100-10 MG/5ML syrup Take 5 mLs by mouth 3 (three) times daily as needed for cough.    Historical Provider, MD  HYDROcodone-homatropine (HYCODAN) 5-1.5 MG/5ML syrup Take 5 mLs by mouth every 6 (six) hours as needed for cough. 02/21/12   Elson Areas, PA-C  naproxen (NAPROSYN) 500 MG tablet Take 1 tablet (500 mg total) by mouth 2 (two) times daily. 07/03/14   Joycie Peek, PA-C  predniSONE (DELTASONE) 20 MG tablet 3 tabs po day one, then 2 po daily x 4 days 01/10/14   Toy Cookey, MD  traMADol (ULTRAM) 50 MG tablet Take 1 tablet (50 mg total) by mouth every 6 (six) hours as needed. 07/03/14   Joycie Peek, PA-C   Triage vitals: BP 132/93 mmHg  Pulse 92  Temp(Src) 98.4 F (36.9 C) (Oral)  Resp 20  Ht 5\' 6"  (1.676 m)  Wt 240 lb (108.863 kg)  BMI 38.76 kg/m2  SpO2 97% Physical Exam  Constitutional: He is oriented to person, place, and time. He appears well-developed and well-nourished. No distress.  HENT:  Head: Normocephalic.  Eyes: Conjunctivae are normal. Pupils are equal, round, and reactive to light. No scleral icterus.  Neck: Normal range of motion. Neck supple. No thyromegaly present.  Cardiovascular: Normal rate, regular rhythm and normal heart sounds.  Exam reveals no gallop and no friction rub.   No murmur heard. Pulmonary/Chest: Effort normal and breath sounds normal. No respiratory distress. He has no wheezes. He has no rales.  Clear auscultation bilaterally  Abdominal: Soft. Bowel sounds are normal. He  exhibits no distension. There is no tenderness. There is no rebound.  Musculoskeletal: Normal range of motion.  Full active range of motion of bilateral lower extremities without difficulty. However, with some discomfort. Muscle compartments soft  Neurological: He is alert and oriented to person, place, and time.  Distal pulses intact and equal bilaterally 2+ pedal edema anterior bilateral legs equally  Skin: Skin is warm and dry. No rash noted.  Psychiatric: He has a normal mood and affect. His behavior is normal.  Nursing note and vitals reviewed.   ED Course  Procedures  DIAGNOSTIC STUDIES: Oxygen Saturation is 97% on RA, normal by my interpretation.  COORDINATION OF CARE:  4:13 PM Discussed treatment plan which includes follow up with PCP with pt at bedside and pt agreed to plan.   Labs Review Labs Reviewed - No data to display  Imaging Review No results found.   EKG Interpretation None     Filed Vitals:   07/03/14 1555  BP: 132/93  Pulse: 92  Temp: 98.4 F (36.9 C)  TempSrc: Oral  Resp: 20  Height:  (1.676 m)  Weight: 240 lb (108.863 kg)  SpO2: 97%    MDM  Vitals stable - WNL -afebrile Pt resting comfortably in ED. PE--physical exam consistent with chronic, musculoskeletal pain no evidence of heart failure exacerbation. No evidence of DVT. PERC neg, doubt PE. No evidence of vascular compromise. Will DC with anti-inflammatory, pain medicines and instructions to follow-up with primary care.  I discussed all relevant lab findings and imaging results with pt and they verbalized understanding. Discussed f/u with PCP within 48 hrs and return precautions, pt very amenable to plan.  Final diagnoses:  Bilateral knee pain    I personally performed the services described in this documentation, which was scribed in my presence. The recorded information has been reviewed and is accurate.    Joycie Peek, PA-C 07/03/14 1627  Gwyneth Sprout, MD 07/03/14  [phone]

## 2014-07-03 NOTE — Discharge Instructions (Signed)
It is important to follow-up with your primary care for further evaluation and management of your symptoms. Please take her medications as prescribed. Do not take your naproxen in addition to your Motrin. Return to ED for new or worsening symptoms.  Arthralgia Your caregiver has diagnosed you as suffering from an arthralgia. Arthralgia means there is pain in a joint. This can come from many reasons including:  Bruising the joint which causes soreness (inflammation) in the joint.  Wear and tear on the joints which occur as we grow older (osteoarthritis).  Overusing the joint.  Various forms of arthritis.  Infections of the joint. Regardless of the cause of pain in your joint, most of these different pains respond to anti-inflammatory drugs and rest. The exception to this is when a joint is infected, and these cases are treated with antibiotics, if it is a bacterial infection. HOME CARE INSTRUCTIONS   Rest the injured area for as long as directed by your caregiver. Then slowly start using the joint as directed by your caregiver and as the pain allows. Crutches as directed may be useful if the ankles, knees or hips are involved. If the knee was splinted or casted, continue use and care as directed. If an stretchy or elastic wrapping bandage has been applied today, it should be removed and re-applied every 3 to 4 hours. It should not be applied tightly, but firmly enough to keep swelling down. Watch toes and feet for swelling, bluish discoloration, coldness, numbness or excessive pain. If any of these problems (symptoms) occur, remove the ace bandage and re-apply more loosely. If these symptoms persist, contact your caregiver or return to this location.  For the first 24 hours, keep the injured extremity elevated on pillows while lying down.  Apply ice for 15-20 minutes to the sore joint every couple hours while awake for the first half day. Then 03-04 times per day for the first 48 hours. Put the  ice in a plastic bag and place a towel between the bag of ice and your skin.  Wear any splinting, casting, elastic bandage applications, or slings as instructed.  Only take over-the-counter or prescription medicines for pain, discomfort, or fever as directed by your caregiver. Do not use aspirin immediately after the injury unless instructed by your physician. Aspirin can cause increased bleeding and bruising of the tissues.  If you were given crutches, continue to use them as instructed and do not resume weight bearing on the sore joint until instructed. Persistent pain and inability to use the sore joint as directed for more than 2 to 3 days are warning signs indicating that you should see a caregiver for a follow-up visit as soon as possible. Initially, a hairline fracture (break in bone) may not be evident on X-rays. Persistent pain and swelling indicate that further evaluation, non-weight bearing or use of the joint (use of crutches or slings as instructed), or further X-rays are indicated. X-rays may sometimes not show a small fracture until a week or 10 days later. Make a follow-up appointment with your own caregiver or one to whom we have referred you. A radiologist (specialist in reading X-rays) may read your X-rays. Make sure you know how you are to obtain your X-ray results. Do not assume everything is normal if you do not hear from [place]. SEEK MEDICAL CARE IF: Bruising, swelling, or pain increases. SEEK IMMEDIATE MEDICAL CARE IF:   Your fingers or toes are numb or blue.  The pain is not responding to  medications and continues to stay the same or get worse.  The pain in your joint becomes severe.  You develop a fever over 102 F (38.9 C).  It becomes impossible to move or use the joint. MAKE SURE YOU:   Understand these instructions.  Will watch your condition.  Will get help right away if you are not doing well or get worse. Document Released: 12/30/2004 Document Revised:  03/24/2011 Document Reviewed: 08/18/2007 [facility] Patient Information 2015 [place], [place]. This information is not intended to replace advice given to you by your health care provider. Make sure you discuss any questions you have with your health care provider.  Arthritis, Nonspecific Arthritis is inflammation of a joint. This usually means pain, redness, warmth or swelling are present. One or more joints may be involved. There are a number of types of arthritis. Your caregiver may not be able to tell what type of arthritis you have right away. CAUSES  The most common cause of arthritis is the wear and tear on the joint (osteoarthritis). This causes damage to the cartilage, which can break down over time. The knees, hips, back and neck are most often affected by this type of arthritis. Other types of arthritis and common causes of joint pain include:  Sprains and other injuries near the joint. Sometimes minor sprains and injuries cause pain and swelling that develop hours later.  Rheumatoid arthritis. This affects hands, feet and knees. It usually affects both sides of your body at the same time. It is often associated with chronic ailments, fever, weight loss and general weakness.  Crystal arthritis. Gout and pseudo gout can cause occasional acute severe pain, redness and swelling in the foot, ankle, or knee.  Infectious arthritis. Bacteria can get into a joint through a break in overlying skin. This can cause infection of the joint. Bacteria and viruses can also spread through the blood and affect your joints.  Drug, infectious and allergy reactions. Sometimes joints can become mildly painful and slightly swollen with these types of illnesses. SYMPTOMS   Pain is the main symptom.  Your joint or joints can also be red, swollen and warm or hot to the touch.  You may have a fever with certain types of arthritis, or even feel overall ill.  The joint with arthritis will hurt with movement.  Stiffness is present with some types of arthritis. DIAGNOSIS  Your caregiver will suspect arthritis based on your description of your symptoms and on your exam. Testing may be needed to find the type of arthritis:  Blood and sometimes urine tests.  X-ray tests and sometimes CT or MRI scans.  Removal of fluid from the joint (arthrocentesis) is done to check for bacteria, crystals or other causes. Your caregiver (or a specialist) will numb the area over the joint with a local anesthetic, and use a needle to remove joint fluid for examination. This procedure is only minimally uncomfortable.  Even with these tests, your caregiver may not be able to tell what kind of arthritis you have. Consultation with a specialist (rheumatologist) may be helpful. TREATMENT  Your caregiver will discuss with you treatment specific to your type of arthritis. If the specific type cannot be determined, then the following general recommendations may apply. Treatment of severe joint pain includes:  Rest.  Elevation.  Anti-inflammatory medication (for example, ibuprofen) may be prescribed. Avoiding activities that cause increased pain.  Only take over-the-counter or prescription medicines for pain and discomfort as recommended by your caregiver.  Cold packs  over an inflamed joint may be used for 10 to 15 minutes every hour. Hot packs sometimes feel better, but do not use overnight. Do not use hot packs if you are diabetic without your caregiver's permission.  A cortisone shot into arthritic joints may help reduce pain and swelling.  Any acute arthritis that gets worse over the next 1 to 2 days needs to be looked at to be sure there is no joint infection. Long-term arthritis treatment involves modifying activities and lifestyle to reduce joint stress jarring. This can include weight loss. Also, exercise is needed to nourish the joint cartilage and remove waste. This helps keep the muscles around the joint  strong. HOME CARE INSTRUCTIONS   Do not take aspirin to relieve pain if gout is suspected. This elevates uric acid levels.  Only take over-the-counter or prescription medicines for pain, discomfort or fever as directed by your caregiver.  Rest the joint as much as possible.  If your joint is swollen, keep it elevated.  Use crutches if the painful joint is in your leg.  Drinking plenty of fluids may help for certain types of arthritis.  Follow your caregiver's dietary instructions.  Try low-impact exercise such as:  Swimming.  Water aerobics.  Biking.  Walking.  Morning stiffness is often relieved by a warm shower.  Put your joints through regular range-of-motion. SEEK MEDICAL CARE IF:   You do not feel better in 24 hours or are getting worse.  You have side effects to medications, or are not getting better with treatment. SEEK IMMEDIATE MEDICAL CARE IF:   You have a fever.  You develop severe joint pain, swelling or redness.  Many joints are involved and become painful and swollen.  There is severe back pain and/or leg weakness.  You have loss of bowel or bladder control. Document Released: 02/07/2004 Document Revised: 03/24/2011 Document Reviewed: 02/23/2008 [facility] Patient Information 2015 [place], [place]. This information is not intended to replace advice given to you by your health care provider. Make sure you discuss any questions you have with your health care provider.

## 2014-11-08 ENCOUNTER — Emergency Department (HOSPITAL_BASED_OUTPATIENT_CLINIC_OR_DEPARTMENT_OTHER)
Admission: EM | Admit: 2014-11-08 | Discharge: 2014-11-08 | Disposition: A | Discharge status: Home / Self Care | Payer: Self-pay | Attending: Emergency Medicine | Admitting: Emergency Medicine

## 2014-11-08 ENCOUNTER — Encounter (HOSPITAL_BASED_OUTPATIENT_CLINIC_OR_DEPARTMENT_OTHER): Payer: Self-pay

## 2014-11-08 DIAGNOSIS — Z880 Allergy status to penicillin: Secondary | ICD-10-CM | POA: Insufficient documentation

## 2014-11-08 DIAGNOSIS — M25561 Pain in right knee: Secondary | ICD-10-CM | POA: Insufficient documentation

## 2014-11-08 DIAGNOSIS — J45909 Unspecified asthma, uncomplicated: Secondary | ICD-10-CM | POA: Insufficient documentation

## 2014-11-08 DIAGNOSIS — Z791 Long term (current) use of non-steroidal anti-inflammatories (NSAID): Secondary | ICD-10-CM | POA: Insufficient documentation

## 2014-11-08 DIAGNOSIS — Z87828 Personal history of other (healed) physical injury and trauma: Secondary | ICD-10-CM | POA: Insufficient documentation

## 2014-11-08 DIAGNOSIS — G8929 Other chronic pain: Secondary | ICD-10-CM | POA: Insufficient documentation

## 2014-11-08 DIAGNOSIS — Z792 Long term (current) use of antibiotics: Secondary | ICD-10-CM | POA: Insufficient documentation

## 2014-11-08 DIAGNOSIS — Z79899 Other long term (current) drug therapy: Secondary | ICD-10-CM | POA: Insufficient documentation

## 2014-11-08 DIAGNOSIS — Z9889 Other specified postprocedural states: Secondary | ICD-10-CM | POA: Insufficient documentation

## 2014-11-08 MED ORDER — NAPROXEN 375 MG PO TABS: 375.0000 mg | ORAL_TABLET | Freq: Two times a day (BID) | ORAL | Status: AC

## 2014-11-08 MED ORDER — TRAMADOL HCL 50 MG PO TABS: 100.0000 mg | ORAL_TABLET | Freq: Four times a day (QID) | ORAL | Status: AC | PRN

## 2014-11-08 NOTE — Discharge Instructions (Signed)
Knee Pain Knee pain is a very common symptom and can have many causes. Knee pain often goes away when you follow your health care provider's instructions for relieving pain and discomfort at home. However, knee pain can develop into a condition that needs treatment. Some conditions may include:  Arthritis caused by wear and tear (osteoarthritis).  Arthritis caused by swelling and irritation (rheumatoid arthritis or gout).  A cyst or growth in your knee.  An infection in your knee joint.  An injury that will not heal.  Damage, swelling, or irritation of the tissues that support your knee (torn ligaments or tendinitis). If your knee pain continues, additional tests may be ordered to diagnose your condition. Tests may include X-rays or other imaging studies of your knee. You may also need to have fluid removed from your knee. Treatment for ongoing knee pain depends on the cause, but treatment may include:  Medicines to relieve pain or swelling.  Steroid injections in your knee.  Physical therapy.  Surgery. HOME CARE INSTRUCTIONS  Take medicines only as directed by your health care provider.  Rest your knee and keep it raised (elevated) while you are resting.  Do not do things that cause or worsen pain.  Avoid high-impact activities or exercises, such as running, jumping rope, or doing jumping jacks.  Apply ice to the knee area:  Put ice in a plastic bag.  Place a towel between your skin and the bag.  Leave the ice on for 20 minutes, 2-3 times a day.  Ask your health care provider if you should wear an elastic knee support.  Keep a pillow under your knee when you sleep.  Lose weight if you are overweight. Extra weight can put pressure on your knee.  Do not use any tobacco products, including cigarettes, chewing tobacco, or electronic cigarettes. If you need help quitting, ask your health care provider. Smoking may slow the healing of any bone and joint problems that you may  have. SEEK MEDICAL CARE IF:  Your knee pain continues, changes, or gets worse.  You have a fever along with knee pain.  Your knee buckles or locks up.  Your knee becomes more swollen. SEEK IMMEDIATE MEDICAL CARE IF:   Your knee joint feels hot to the touch.  You have chest pain or trouble breathing.   This information is not intended to replace advice given to you by your health care provider. Make sure you discuss any questions you have with your health care provider.   Document Released: 10/27/2006 Document Revised: 01/20/2014 Document Reviewed: 08/15/2013 Elsevier Interactive Patient Education 2016 Elsevier Inc. Osteoarthritis Osteoarthritis is a disease that causes soreness and inflammation of a joint. It occurs when the cartilage at the affected joint wears down. Cartilage acts as a cushion, covering the ends of bones where they meet to form a joint. Osteoarthritis is the most common form of arthritis. It often occurs in older people. The joints affected most often by this condition include those in the:  Ends of the fingers.  Thumbs.  Neck.  Lower back.  Knees.  Hips. CAUSES  Over time, the cartilage that covers the ends of bones begins to wear away. This causes bone to rub on bone, producing pain and stiffness in the affected joints.  RISK FACTORS Certain factors can increase your chances of having osteoarthritis, including:  Older age.  Excessive body weight.  Overuse of joints.  Previous joint injury. SIGNS AND SYMPTOMS   Pain, swelling, and stiffness in the  joint.  Over time, the joint may lose its normal shape.  Small deposits of bone (osteophytes) may grow on the edges of the joint.  Bits of bone or cartilage can break off and float inside the joint space. This may cause more pain and damage. DIAGNOSIS  Your health care provider will do a physical exam and ask about your symptoms. Various tests may be ordered, such as:  X-rays of the affected  joint.  Blood tests to rule out other types of arthritis. Additional tests may be used to diagnose your condition. TREATMENT  Goals of treatment are to control pain and improve joint function. Treatment plans may include:  A prescribed exercise program that allows for rest and joint relief.  A weight control plan.  Pain relief techniques, such as:  Properly applied heat and cold.  Electric pulses delivered to nerve endings under the skin (transcutaneous electrical nerve stimulation [TENS]).  Massage.  Certain nutritional supplements.  Medicines to control pain, such as:  Acetaminophen.  Nonsteroidal anti-inflammatory drugs (NSAIDs), such as naproxen.  Narcotic or central-acting agents, such as tramadol.  Corticosteroids. These can be given orally or as an injection.  Surgery to reposition the bones and relieve pain (osteotomy) or to remove loose pieces of bone and cartilage. Joint replacement may be needed in advanced states of osteoarthritis. HOME CARE INSTRUCTIONS   Take medicines only as directed by your health care provider.  Maintain a healthy weight. Follow your health care provider's instructions for weight control. This may include dietary instructions.  Exercise as directed. Your health care provider can recommend specific types of exercise. These may include:  Strengthening exercises. These are done to strengthen the muscles that support joints affected by arthritis. They can be performed with weights or with exercise bands to add resistance.  Aerobic activities. These are exercises, such as brisk walking or low-impact aerobics, that get your heart pumping.  Range-of-motion activities. These keep your joints limber.  Balance and agility exercises. These help you maintain daily living skills.  Rest your affected joints as directed by your health care provider.  Keep all follow-up visits as directed by your health care provider. SEEK MEDICAL CARE IF:    Your skin turns red.  You develop a rash in addition to your joint pain.  You have worsening joint pain.  You have a fever along with joint or muscle aches. SEEK IMMEDIATE MEDICAL CARE IF:  You have a significant loss of weight or appetite.  You have night sweats. FOR MORE INFORMATION   National Institute of Arthritis and Musculoskeletal and Skin Diseases: www.niams.http://www.myers.net/  [employer] on Aging: https://walker.com/  American College of Rheumatology: www.rheumatology.org   This information is not intended to replace advice given to you by your health care provider. Make sure you discuss any questions you have with your health care provider.   Document Released: 12/30/2004 Document Revised: 01/20/2014 Document Reviewed: 09/06/2012 Elsevier Interactive Patient Education 2016 [employer].  Emergency Department Resource Guide 1) Find a Doctor and Pay Out of Pocket Although you won't have to find out who is covered by your insurance plan, it is a good idea to ask around and get recommendations. You will then need to call the office and see if the doctor you have chosen will accept you as a new patient and what types of options they offer for patients who are self-pay. Some doctors offer discounts or will set up payment plans for their patients who do not have insurance, but you will  need to ask so you aren't surprised when you get to your appointment.  2) Contact Your Local Health Department Not all health departments have doctors that can see patients for sick visits, but many do, so it is worth a call to see if yours does. If you don't know where your local health department is, you can check in your phone book. The CDC also has a tool to help you locate your state's health department, and many state websites also have listings of all of their local health departments.  3) Find a Walk-in Clinic If your illness is not likely to be very severe or complicated, you may want to try  a walk in clinic. These are popping up all over the country in pharmacies, drugstores, and shopping centers. They're usually staffed by nurse practitioners or physician assistants that have been trained to treat common illnesses and complaints. They're usually fairly quick and inexpensive. However, if you have serious medical issues or chronic medical problems, these are probably not your best option.  No Primary Care Doctor: - Call Health Connect at  [phone] - they can help you locate a primary care doctor that  accepts your insurance, provides certain services, etc. - Physician Referral Service- [phone]  Chronic Pain Problems: Organization         Address  Phone   Notes  [facility]  [phone] Patients need to be referred by their primary care doctor.   Medication Assistance: Organization         Address  Phone   Notes  [facility] Medication [facility] [address] [place] [place], [place] [address] [phone] --Must be a resident of [facility] -- Must have NO insurance coverage whatsoever (no Medicaid/ Medicare, etc.) -- The pt. MUST have a primary care doctor that directs their care regularly and follows them in the community   [facility]  [phone]   [employer]  [phone]    Agencies that provide inexpensive medical care: Organization         Address  Phone   Notes  [facility]  [phone]   [facility]    [phone]   [facility] [address] [place], [place] [address] [phone]   [facility] [address] [place] [place] [phone]   Planned Parenthood    [phone]   [facility]    [phone]   [facility]  [address], [place] Phone:  [phone], Fax:  [phone] Hours of Operation:  9 am - 6 pm, M-F.  Also accepts Medicaid/Medicare and self-pay.  [facility]  [address], [place] Phone: [phone], Fax: [phone]. Hours of Operation:  8:30 am - 5:30 pm, M-F.  Also accepts Medicaid and self-pay.  [facility] [place] [address] [place] Phone: [phone]   [facility] [address] Natasha Bence [place], [place] [phone], Ext. [phone] Mondays & Thursdays: 7-9 AM.  First 15 patients are seen on a first come, first serve basis.    Medicaid-accepting [facility] Providers:  Organization         Address  Phone   Notes  [facility] [address], [place] [phone] Also accepts self-pay patients.  [facility] [address] [place], [address], [address]  (  [phone]   [facility] [address], [place] [phone]   [facility] [address] [place] [phone]   Renaye Rakers [address], [place]   [phone] Only accepts [place] Access [place] patients after they have their name applied to their card.   Self-Pay (no insurance) in [facility]:  Organization         Address  Phone   Notes  Sickle Cell Patients, [facility] [address] [place], [place] [phone]   [facility] [address] [place], [place] [phone]   [facility] [place]  [address], [place] [phone]   [facility]/Dr. Osei-Bonsu  [address] [place] or [address], [place] [phone] Phone number for both [place] and [place] locations is the same.  [facility] [address] [place] [phone]   [facility] [address] [place] or [address] [phone]   [facility] [address], [place] [phone], phone; [phone], fax  Sees patients 1st and 3rd Saturday of every month.  Must not qualify for public or private insurance (i.e. Medicaid, Medicare, [place] Health Choice, Veterans' Benefits)  Household income should be no more than 200% of the poverty level The clinic cannot treat you if you are pregnant or think you are pregnant  Sexually transmitted diseases are not treated at the clinic.    Dental Care: Organization         Address  Phone  Notes  [facility] of [facility] [address] [place], [place] [phone] Accepts children up to age 29 who are enrolled in [place] or [place] Health Choice; pregnant women with a Medicaid card; and children who have applied for Medicaid or [place] Health Choice, but were declined, whose parents can pay a reduced fee at time of service.  [facility] Department of [facility]  [address], [place] [phone] Accepts children up to age 28 who are enrolled in [place] or [place] Health Choice; pregnant women with a Medicaid card; and children who have applied for Medicaid or [place] Health Choice, but were declined, whose parents can pay a reduced fee at time of service.  [facility]  [address] [place], [place] [phone] Patients are seen by appointment only. Walk-ins are not accepted. [facility] will see patients 69 years of age and older. Monday - Tuesday (8am-5pm) Most Wednesdays (8:30-5pm) $30 per visit, cash only  [facility]  [address], [facility] [phone] Patients are seen by appointment only. Walk-ins are not accepted. [facility] will see patients 22 years of age and older. One Wednesday Evening (Monthly: Volunteer Based).  $30 per visit, cash only  [employer]  [phone] for adults; Children under age 4, call Graduate Pediatric Dentistry at [phone]. Children aged 72-14, please call [phone] to  request a pediatric application.  Dental services are provided in all areas of dental care including fillings, crowns and bridges, complete and partial dentures, implants, gum treatment, root canals, and extractions. Preventive care is also provided. Treatment is provided to both adults and children. Patients are selected via a lottery and there is  often a waiting list.   [facility] [address] [place]  [phone] [email or web address]   [facility] [address] [place], [place] [phone], Ext. [phone] Second and Fourth Thursday of each month, opens at 6:30 AM; Clinic ends at 9 AM.  Patients are seen on a first-come first-served basis, and a limited number are seen during each clinic.   [facility]  [address] Ether Griffins [place], [place] [phone]   Eligibility Requirements You must have lived in [place], [place], or [place] counties for at least the last three months.   You cannot be eligible for state or federal sponsored [place], including [employer], [place], or [employer].   You generally cannot be eligible for healthcare insurance through your employer.    How to apply: Eligibility screenings are held every Tuesday and Wednesday afternoon from 1:00 pm until 4:00 pm. You do not need an appointment for the interview!  [facility] [address] [place], [place] [phone]   [facility] Health Department  [phone]   [facility] Health Department  [phone]   [facility] Health Department  [phone]    Behavioral Health Resources in the Community: Intensive Outpatient Programs Organization         Address  Phone  Notes  [facility] [address] [place], [place] [phone]   [facility] Outpatient [address], [place], [place] [phone]   [facility] [address], [place], [place]  [phone]   [facility] [address]  [place], [place] [phone] or [phone]   Substance Abuse Resources Organization         Address  Phone  Notes  [facility]  [phone]   [facility]  [phone]   [facility]  [phone]   Floydene Flock  [phone]   Residential & Outpatient Substance Abuse Program  6804451033   Psychological Services Organization         Address  Phone  Notes  [facility]  [phone]   [facility] Services  [phone]   [facility] [address], [place] [phone] or [phone]    Mobile Crisis Teams Organization         Address  Phone  Notes  [facility], Mobile Crisis Care Unit  [phone]   [facility]  [address] [place], [place] [phone]   Doristine Locks [address] [place] [phone]    Self-Help/Support Groups Organization         Address  Phone             Notes  [facility] [place] - variety of support groups  336- I7437963 Call for more information  Narcotics Anonymous (NA), [facility] [address], [employer] [place]  2 meetings at this location   Statistician         Address  Phone  Notes  [facility] [address],    [place]  [phone]   [facility]  [address] [place] [address], [place], [place] [phone]   [facility] [address] [place], [place] [phone] Admissions: 8am-3pm M-F  [facility] [address]    [place], [place] [phone]   [facility] [address] [place], [place], [place] [phone]  [facility] [address]  [place], [place] [phone]   [facility] - Intensive Outpatient [address], [place], [place] [phone]   [facility] ([facility] [address] [place]  [place], [place] [phone] or [phone]   [facility] ([facility]) [address], [place], [place] [phone] Accepts Medicaid  [facility] [address]  [place] [phone] Substance Abuse/Addiction Treatment   [facility] Organization         Address  Phone  Notes  [facility]  [phone]   Angie Fava, PhD [address] Ervin Knack [place], [place]   [phone] or [phone]   [facility] Behavioral   [address] [place], [place] [phone]   [facility] [address], [place], [place] [phone] Insurance/Medicaid/sponsorship through [facility] and Families [address]                                    [place], [place] [phone] Therapy/tele-psych/case  [facility] [address], [place] [phone]    Dr. Lolly Mustache  [phone]   [facility]  [employer] [facility] Dept. 1) [address]  2) [address] [place] 3)  [address], [place] [phone]  [phone]   [facility] Child Abuse Hotline [phone] or [phone] (After Hours)
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
## 2014-11-08 NOTE — ED Notes (Signed)
MD at bedside. 

## 2014-11-08 NOTE — ED Notes (Signed)
Pt c/o chronic right knee pain since a fall in July

## 2016-06-11 IMAGING — CR DG CHEST 2V
2 series · 2 of 2 positions shown · non-contrast
Comparison: 12/26/2013.  02/21/2012.

CLINICAL DATA: Cough and wheezing for the last 2 weeks. Chest
tightness.

EXAM:
CHEST  2 VIEW

[w chest pa]
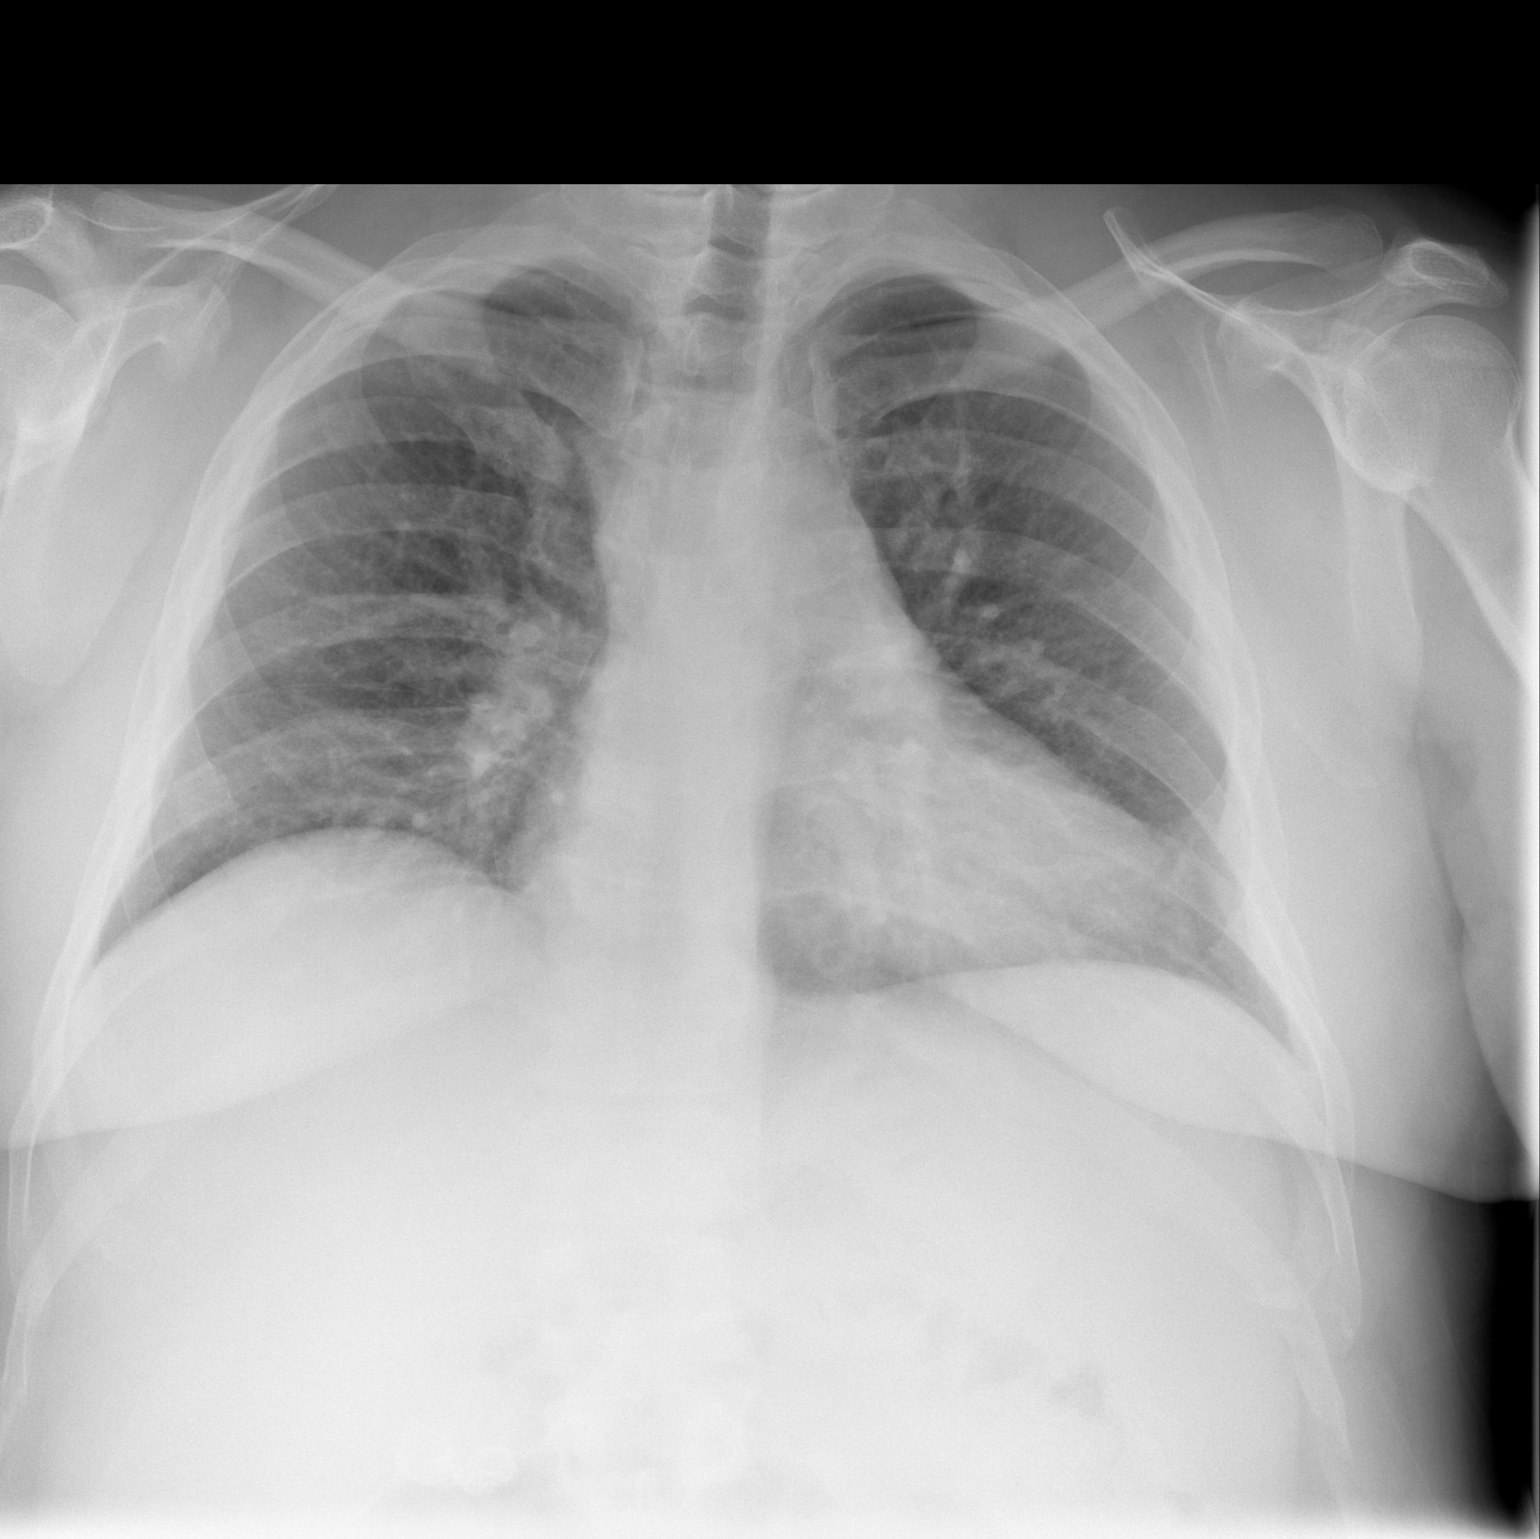

[w chest lat]
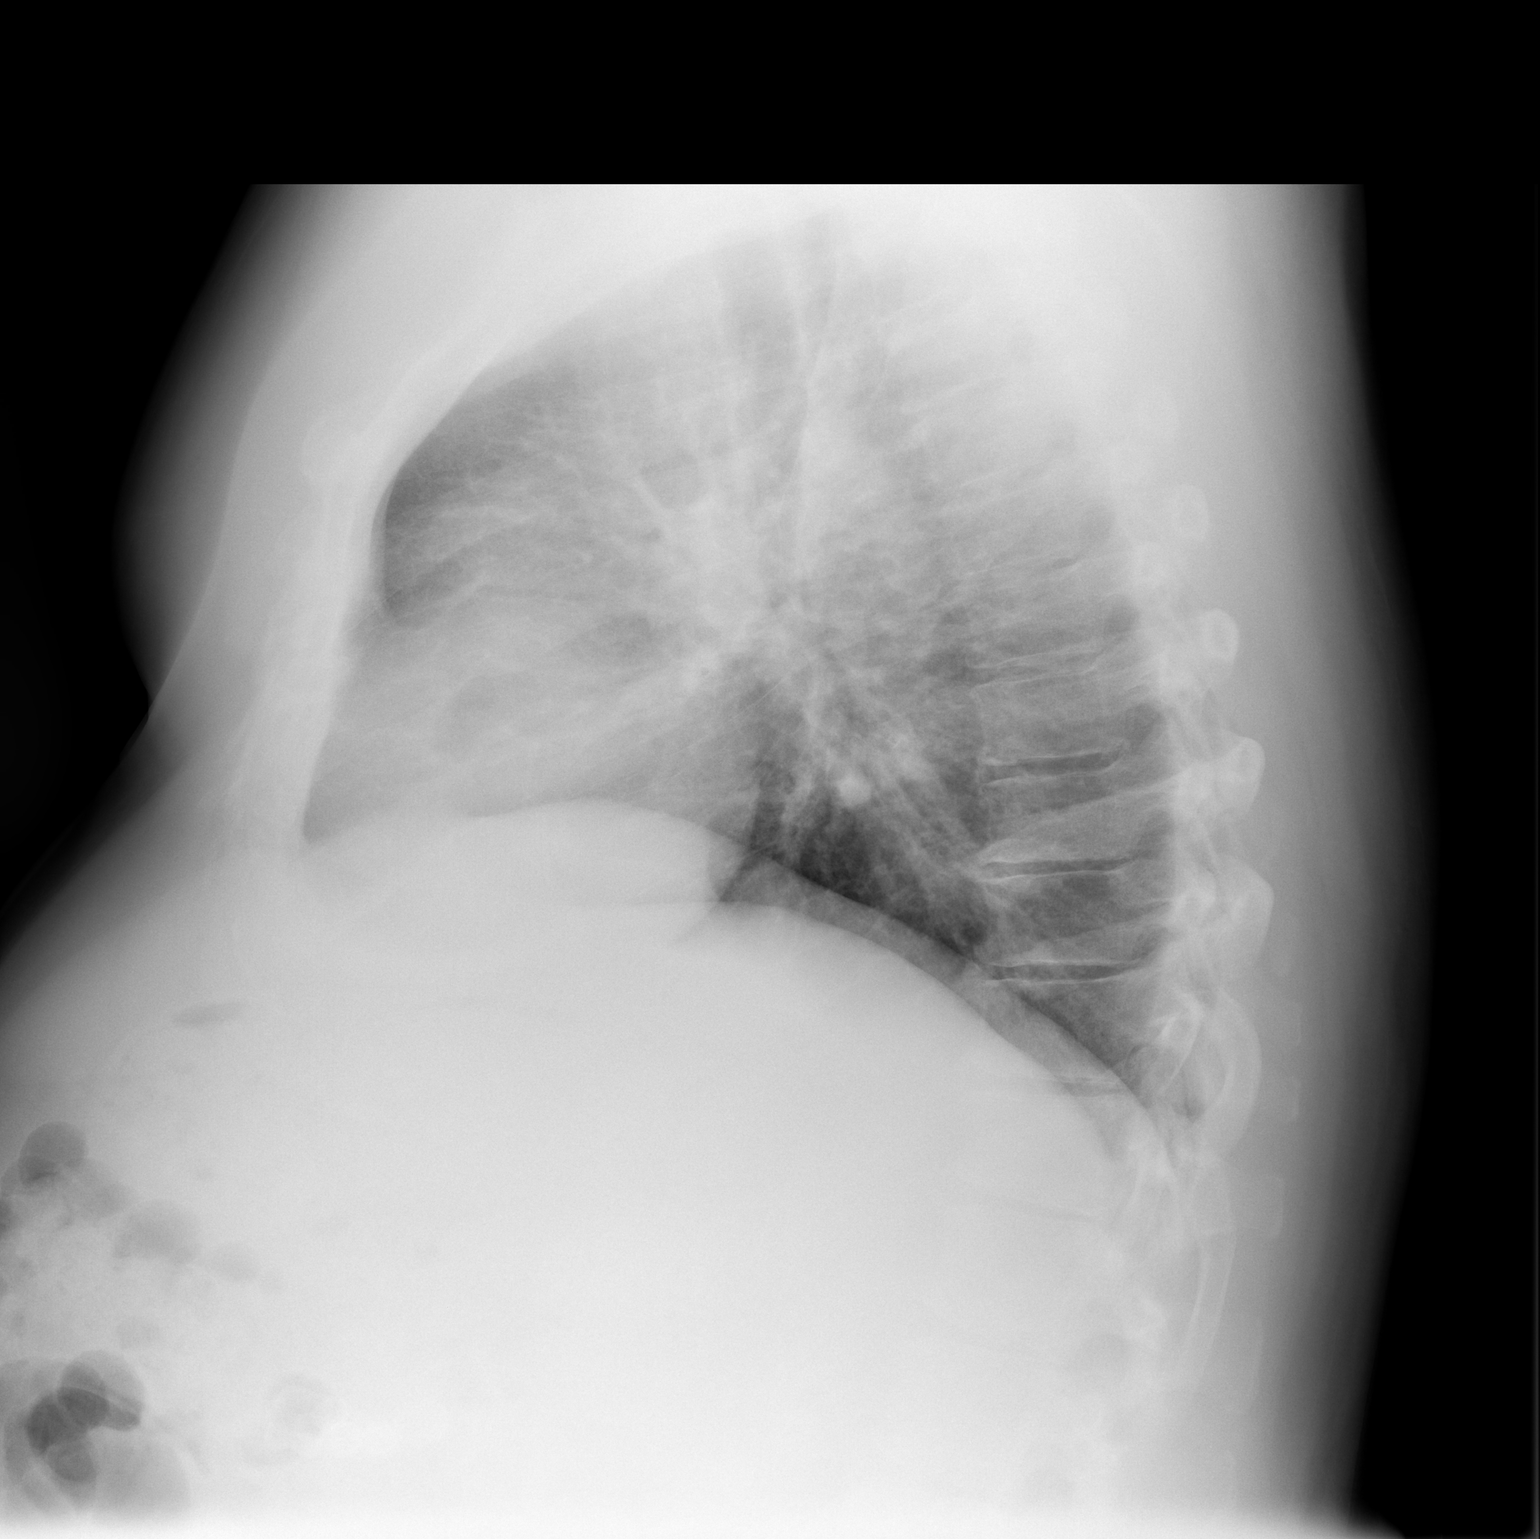

[2 of 2 positions shown; findings below may reference images not displayed]

FINDINGS: Heart size is normal. Mediastinal shadows are normal. There is
central bronchial thickening but there is no infiltrate, collapse or
effusion. Ordinary degenerative changes affect the spine.
IMPRESSION: Bronchitis.  No consolidation or collapse.
